# Patient Record
Sex: Male | Born: 1980 | Race: White | Hispanic: No | Marital: Single | Smoking: Current every day smoker
Health system: Southern US, Community
[De-identification: ages and names within clinical notes are randomized; demographics above are authoritative.]

---

## 2005-01-12 ENCOUNTER — Emergency Department: Payer: Self-pay | Admitting: Emergency Medicine

## 2006-10-04 ENCOUNTER — Emergency Department: Payer: Self-pay | Admitting: Unknown Physician Specialty

## 2006-10-14 ENCOUNTER — Emergency Department: Payer: Self-pay | Admitting: Unknown Physician Specialty

## 2007-06-01 ENCOUNTER — Emergency Department: Payer: Self-pay | Admitting: Unknown Physician Specialty

## 2009-07-31 ENCOUNTER — Emergency Department: Payer: Self-pay | Admitting: Emergency Medicine

## 2009-11-12 ENCOUNTER — Emergency Department: Payer: Self-pay | Admitting: Internal Medicine

## 2010-02-28 ENCOUNTER — Emergency Department: Payer: Self-pay | Admitting: Emergency Medicine

## 2011-05-15 ENCOUNTER — Emergency Department: Payer: Self-pay | Admitting: Internal Medicine

## 2011-07-14 ENCOUNTER — Emergency Department: Payer: Self-pay | Admitting: Emergency Medicine

## 2011-08-07 ENCOUNTER — Emergency Department: Payer: Self-pay | Admitting: Unknown Physician Specialty

## 2011-09-07 ENCOUNTER — Emergency Department: Payer: Self-pay | Admitting: Emergency Medicine

## 2012-01-30 ENCOUNTER — Emergency Department: Payer: Self-pay | Admitting: Emergency Medicine

## 2012-01-30 LAB — BASIC METABOLIC PANEL
BUN: 9 mg/dL (ref 7–18)
Chloride: 104 mmol/L (ref 98–107)
Creatinine: 0.87 mg/dL (ref 0.60–1.30)
EGFR (Non-African Amer.): >60
Glucose: 89 mg/dL (ref 65–99)
Potassium: 3.7 mmol/L (ref 3.5–5.1)
Sodium: 141 mmol/L (ref 136–145)

## 2012-01-30 LAB — CBC
MCH: 31.3 pg (ref 26.0–34.0)
MCHC: 34.4 g/dL (ref 32.0–36.0)
Platelet: 183 10*3/uL (ref 150–440)

## 2012-03-19 ENCOUNTER — Emergency Department: Payer: Self-pay | Admitting: Emergency Medicine

## 2012-03-22 ENCOUNTER — Emergency Department: Payer: Self-pay | Admitting: Emergency Medicine

## 2012-07-04 ENCOUNTER — Encounter (HOSPITAL_COMMUNITY): Payer: Self-pay | Admitting: Emergency Medicine

## 2012-07-04 ENCOUNTER — Emergency Department (HOSPITAL_COMMUNITY)
Admission: EM | Admit: 2012-07-04 | Discharge: 2012-07-04 | Disposition: A | Discharge status: Home / Self Care | Payer: Self-pay | Attending: Emergency Medicine | Admitting: Emergency Medicine

## 2012-07-04 DIAGNOSIS — K006 Disturbances in tooth eruption: Secondary | ICD-10-CM | POA: Insufficient documentation

## 2012-07-04 DIAGNOSIS — K0889 Other specified disorders of teeth and supporting structures: Secondary | ICD-10-CM

## 2012-07-04 DIAGNOSIS — F17200 Nicotine dependence, unspecified, uncomplicated: Secondary | ICD-10-CM | POA: Insufficient documentation

## 2012-07-04 MED ORDER — TRAMADOL HCL 50 MG PO TABS: 50.0000 mg | ORAL_TABLET | Freq: Four times a day (QID) | ORAL | Status: DC | PRN

## 2012-07-04 MED ORDER — PENICILLIN V POTASSIUM 250 MG PO TABS: 500.0000 mg | ORAL_TABLET | Freq: Four times a day (QID) | ORAL | Status: DC

## 2012-07-04 NOTE — ED Notes (Signed)
Pt c/o left upper tooth pain x 1 week

## 2012-07-04 NOTE — ED Provider Notes (Signed)
History    This chart was scribed for Nathaniel Baker, MD, MD by Smitty Pluck. The patient was seen in room [facility] and the patient's care was started at 5:43PM.   CSN: [id number]  Arrival date & time 07/04/12  1513   First MD Initiated Contact with Patient 07/04/12 1734      Chief Complaint  Patient presents with  . Dental Pain    (Consider location/radiation/quality/duration/timing/severity/associated sxs/prior treatment) Patient is a 31 y.o. male presenting with tooth pain. The history is provided by the patient.  Dental PainPrimary symptoms do not include fever or shortness of breath.   NOLYN SWAB is a 31 y.o. male who presents to the Emergency Department complaining of moderate left upper tooth pain onset 1 week. Pt report that he had bleeding today. Denies fever. Denies radiation. Denies any other pain. Pt took a dose of pcn pta  History reviewed. No pertinent past medical history.  History reviewed. No pertinent past surgical history.  History reviewed. No pertinent family history.  History  Substance Use Topics  . Smoking status: Current Everyday Smoker  . Smokeless tobacco: Not on file  . Alcohol Use: No      Review of Systems  Constitutional: Negative for fever and chills.  Respiratory: Negative for shortness of breath.   Gastrointestinal: Negative for nausea and vomiting.  Neurological: Negative for weakness.    Allergies  Vicodin  Home Medications   Current Outpatient Rx  Name Route Sig Dispense Refill  . CYCLOBENZAPRINE HCL 10 MG PO TABS Oral Take 10 mg by mouth 3 (three) times daily as needed. Muscle spasm.    Marland Kitchen PENICILLIN V POTASSIUM 250 MG PO TABS Oral Take 250 mg by mouth daily. Had some left over from another infection, has been taking those for the past couple days.    . TRAMADOL HCL 50 MG PO TABS Oral Take 50 mg by mouth every 6 (six) hours as needed. pain      BP 132/92  Pulse 87  Temp 98.6 F (37 C) (Oral)  Resp 16  SpO2  99%  Physical Exam  Nursing note and vitals reviewed. Constitutional: He is oriented to person, place, and time. He appears well-developed and well-nourished. No distress.  HENT:  Head: Normocephalic and atraumatic.       Impacted, fractured left upper 3rd molar with no abscess drainage.   Eyes: EOM are normal. Pupils are equal, round, and reactive to light.  Neck: Normal range of motion. Neck supple. No tracheal deviation present.  Cardiovascular: Normal rate.   Pulmonary/Chest: Effort normal. No respiratory distress.  Abdominal: Soft. He exhibits no distension.  Musculoskeletal: Normal range of motion.  Neurological: He is alert and oriented to person, place, and time.  Skin: Skin is warm and dry.  Psychiatric: He has a normal mood and affect. His behavior is normal.    ED Course  Procedures (including critical care time) DIAGNOSTIC STUDIES: Oxygen Saturation is 99% on room air, normal by my interpretation.    COORDINATION OF CARE:    Labs Reviewed - No data to display No results found.   No diagnosis found.    MDM  Pt tx for tooth abscess  I personally performed the services described in this documentation, which was scribed in my presence. The recorded information has been reviewed and considered.         Nathaniel Baker, MD 07/04/12 1754

## 2012-12-17 IMAGING — CR DG KNEE COMPLETE 4+V*R*
1 series · 4 of 4 positions shown · non-contrast
Comparison: none

REASON FOR EXAM: trauma
COMMENTS:

PROCEDURE:     DXR - DXR KNEE RT COMP WITH OBLIQUES  - March 19, 2012  [DATE]
RESULT:     Four views of the right knee are submitted. The bones are
adequately mineralized. I see no evidence of an acute fracture nor
dislocation. There is no tibial plateau abnormality. I see no joint effusion.

[Series 1: ap · 0.17mm/px · 4 of 4 slices shown]
[im 1/4]
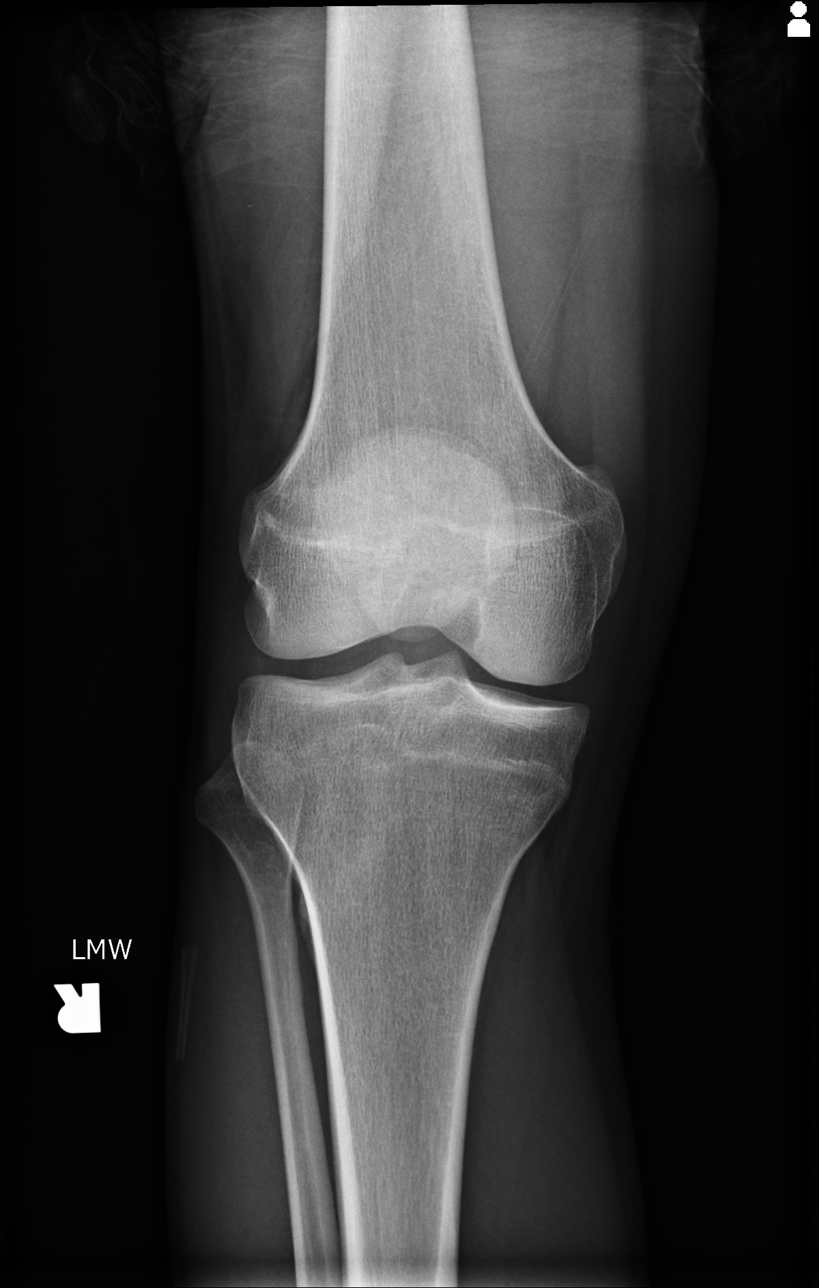
[im 2/4]
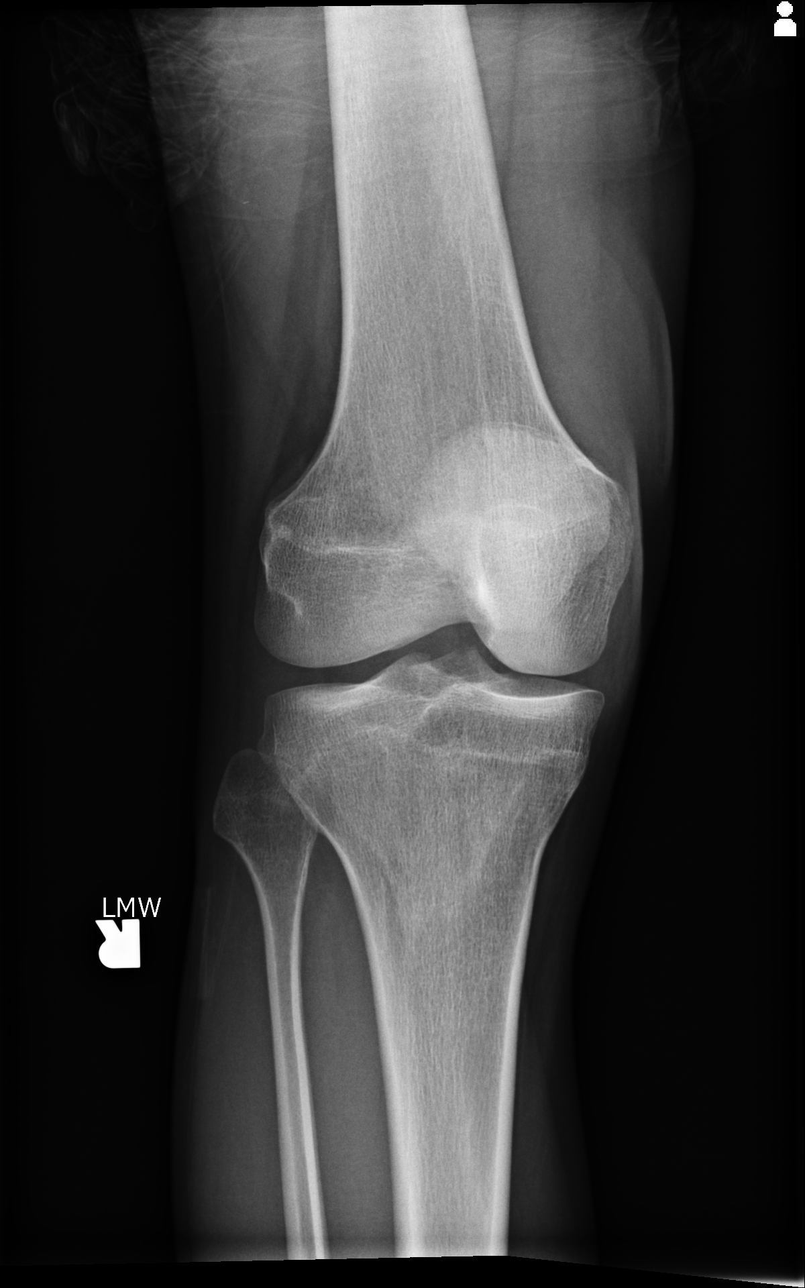
[im 3/4]
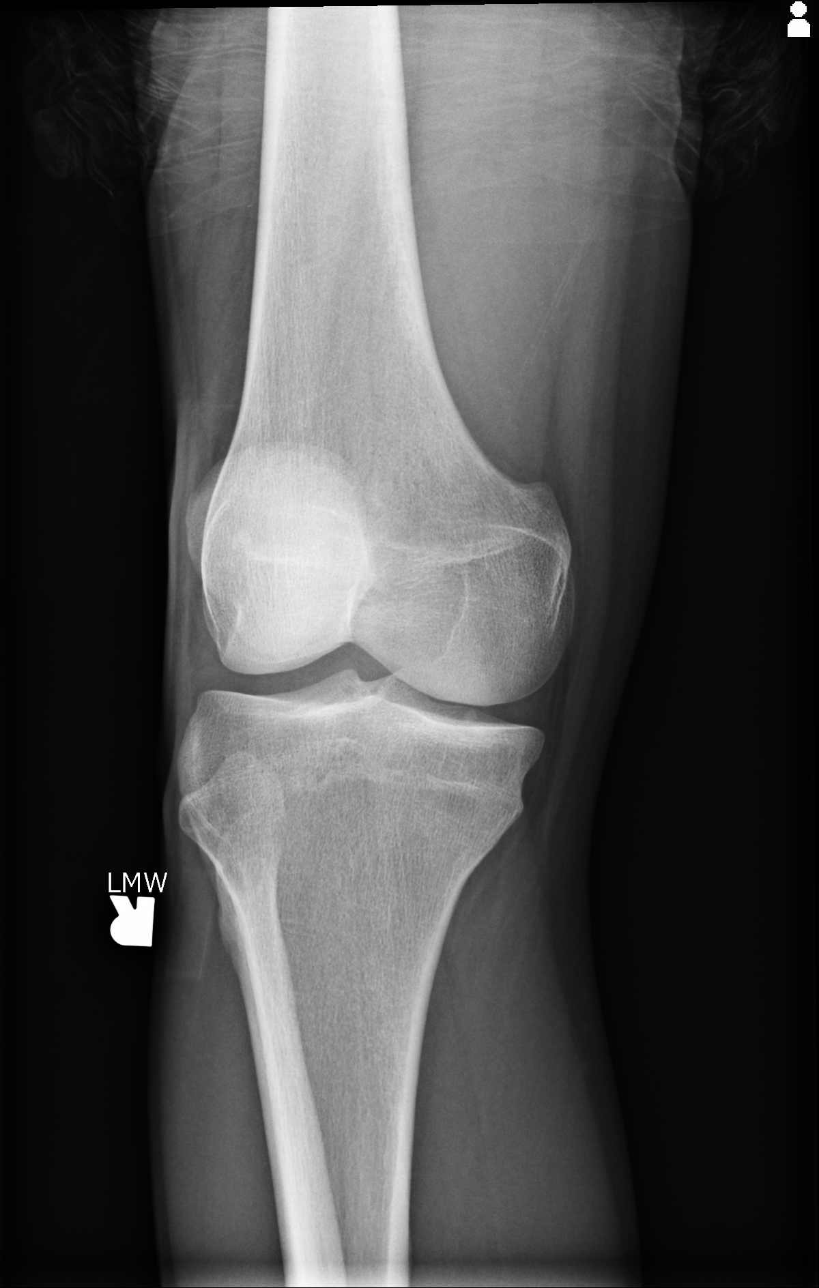
[im 4/4]
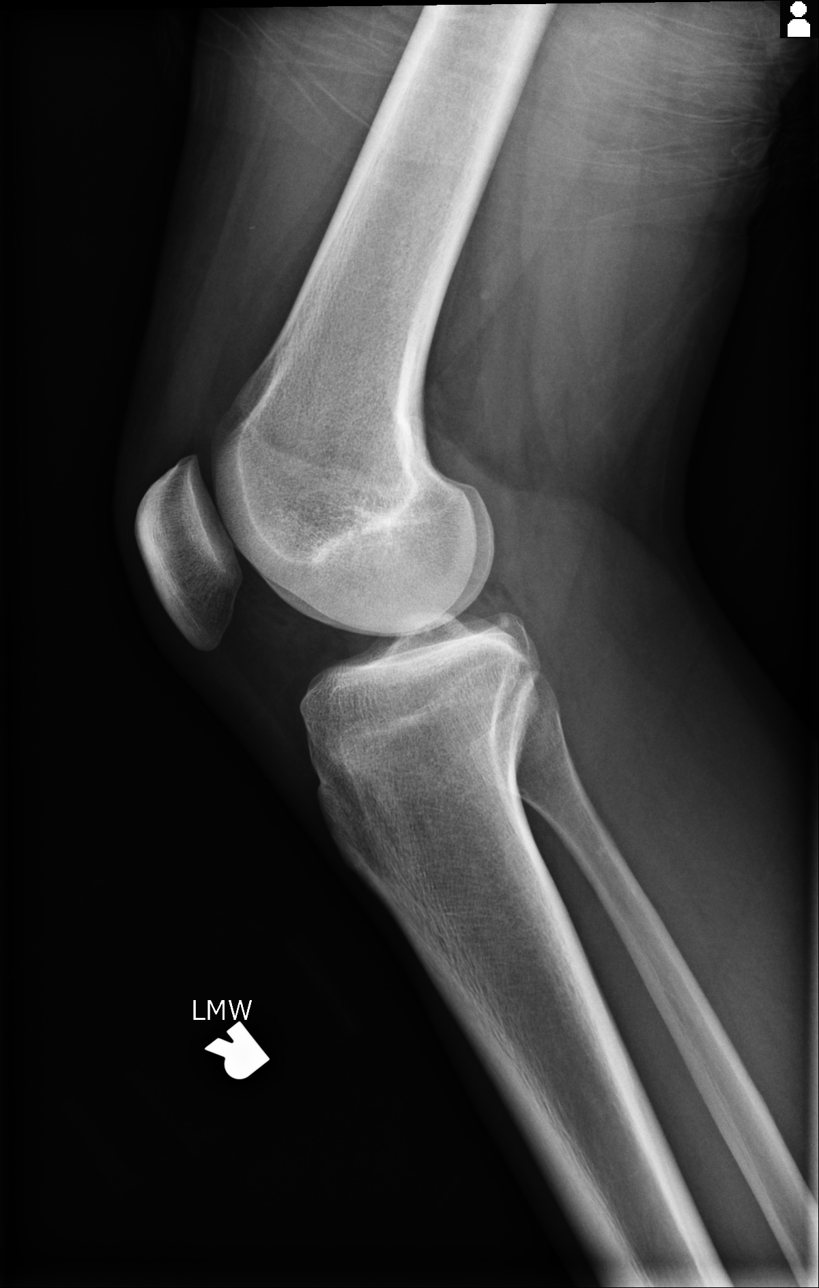

[4 of 4 positions shown; findings below may reference images not displayed]

IMPRESSION: I see no acute bony abnormality of the right knee.

## 2014-08-20 ENCOUNTER — Emergency Department: Payer: Self-pay | Admitting: Emergency Medicine

## 2014-10-03 ENCOUNTER — Emergency Department: Payer: Self-pay | Admitting: Emergency Medicine

## 2014-10-20 ENCOUNTER — Emergency Department: Payer: Self-pay | Admitting: Emergency Medicine

## 2014-11-14 ENCOUNTER — Emergency Department: Payer: Self-pay | Admitting: Emergency Medicine

## 2020-11-18 ENCOUNTER — Emergency Department
Admission: EM | Admit: 2020-11-18 | Discharge: 2020-11-19 | Disposition: A | Discharge status: Home / Self Care | Payer: Self-pay | Attending: Emergency Medicine | Admitting: Emergency Medicine

## 2020-11-18 ENCOUNTER — Other Ambulatory Visit: Payer: Self-pay

## 2020-11-18 ENCOUNTER — Emergency Department: Payer: Self-pay

## 2020-11-18 DIAGNOSIS — R4781 Slurred speech: Secondary | ICD-10-CM | POA: Insufficient documentation

## 2020-11-18 DIAGNOSIS — F17200 Nicotine dependence, unspecified, uncomplicated: Secondary | ICD-10-CM | POA: Insufficient documentation

## 2020-11-18 DIAGNOSIS — T50901A Poisoning by unspecified drugs, medicaments and biological substances, accidental (unintentional), initial encounter: Secondary | ICD-10-CM | POA: Insufficient documentation

## 2020-11-18 DIAGNOSIS — R000 Tachycardia, unspecified: Secondary | ICD-10-CM | POA: Insufficient documentation

## 2020-11-18 LAB — CBC WITH DIFFERENTIAL/PLATELET
Abs Immature Granulocytes: 0.21 10*3/uL — ABNORMAL HIGH (ref 0.00–0.07)
Basophils Absolute: 0.1 10*3/uL (ref 0.0–0.1)
Basophils Relative: 1 %
Eosinophils Absolute: 0 10*3/uL (ref 0.0–0.5)
Eosinophils Relative: 0 %
HCT: 44.7 % (ref 39.0–52.0)
Hemoglobin: 15.5 g/dL (ref 13.0–17.0)
Immature Granulocytes: 2 %
Lymphocytes Relative: 20 %
Lymphs Abs: 2.1 10*3/uL (ref 0.7–4.0)
MCH: 30.3 pg (ref 26.0–34.0)
MCHC: 34.7 g/dL (ref 30.0–36.0)
MCV: 87.5 fL (ref 80.0–100.0)
Monocytes Absolute: 0.7 10*3/uL (ref 0.1–1.0)
Monocytes Relative: 7 %
Neutro Abs: 7.2 10*3/uL (ref 1.7–7.7)
Neutrophils Relative %: 70 %
Platelets: 236 10*3/uL (ref 150–400)
RBC: 5.11 MIL/uL (ref 4.22–5.81)
RDW: 12.4 % (ref 11.5–15.5)
WBC: 10.3 10*3/uL (ref 4.0–10.5)
nRBC: 0 % (ref 0.0–0.2)

## 2020-11-18 LAB — BASIC METABOLIC PANEL
Anion gap: 20 — ABNORMAL HIGH (ref 5–15)
Anion gap: 15 (ref 5–15)
BUN: 25 mg/dL — ABNORMAL HIGH (ref 6–20)
BUN: 21 mg/dL — ABNORMAL HIGH (ref 6–20)
CO2: 17 mmol/L — ABNORMAL LOW (ref 22–32)
CO2: 18 mmol/L — ABNORMAL LOW (ref 22–32)
Calcium: 9.6 mg/dL (ref 8.9–10.3)
Calcium: 8.6 mg/dL — ABNORMAL LOW (ref 8.9–10.3)
Chloride: 97 mmol/L — ABNORMAL LOW (ref 98–111)
Chloride: 101 mmol/L (ref 98–111)
Creatinine, Ser: 1.38 mg/dL — ABNORMAL HIGH (ref 0.61–1.24)
Creatinine, Ser: 1.11 mg/dL (ref 0.61–1.24)
GFR, Estimated: >60 mL/min (ref >60)
GFR, Estimated: >60 mL/min (ref >60)
Glucose, Bld: 139 mg/dL — ABNORMAL HIGH (ref 70–99)
Glucose, Bld: 116 mg/dL — ABNORMAL HIGH (ref 70–99)
Potassium: 3.5 mmol/L (ref 3.5–5.1)
Potassium: 4.3 mmol/L (ref 3.5–5.1)
Sodium: 134 mmol/L — ABNORMAL LOW (ref 135–145)
Sodium: 134 mmol/L — ABNORMAL LOW (ref 135–145)

## 2020-11-18 MED ORDER — LACTATED RINGERS IV BOLUS
1000.0000 mL | Freq: Once | INTRAVENOUS | Status: AC
Start: 2020-11-18 — End: 2020-11-19
  Administered 2020-11-18: 1000 mL via INTRAVENOUS

## 2020-11-18 MED ORDER — SODIUM CHLORIDE 0.9 % IV BOLUS
1000.0000 mL | Freq: Once | INTRAVENOUS | Status: AC
  Administered 2020-11-18: 1000 mL via INTRAVENOUS

## 2020-11-18 MED ORDER — ONDANSETRON HCL 4 MG/2ML IJ SOLN
4.0000 mg | Freq: Once | INTRAMUSCULAR | Status: AC
  Administered 2020-11-18: 4 mg via INTRAVENOUS
  Filled 2020-11-18: qty 2

## 2020-11-18 MED ORDER — KETOROLAC TROMETHAMINE 30 MG/ML IJ SOLN
30.0000 mg | Freq: Once | INTRAMUSCULAR | Status: AC
  Administered 2020-11-18: 30 mg via INTRAVENOUS
  Filled 2020-11-18: qty 1

## 2020-11-18 NOTE — ED Notes (Signed)
Officers at bedside.  

## 2020-11-18 NOTE — ED Provider Notes (Addendum)
[facility] Emergency Department Provider Note ____________________________________________   First MD Initiated Contact with Patient 11/18/20 2046     (approximate)  I have reviewed the triage vital signs and the nursing notes.   HISTORY  Chief Complaint Drug Overdose  Level 5 caveat: History present illness limited due to intoxication/altered mental status  HPI Nathaniel Coffey is a 39 y.o. male with no active medical problems who presents after unintentional overdose.  [facility] and states that he snorted what he believed was fentanyl.  When EMS arrived, CPR was in progress.  Jail staff had given IM Narcan.  IV Narcan was given, and the patient began to breathe spontaneously and awoke.  He continues to be somewhat intoxicated appearing but is able to answer questions.  He reports pain to the chest where he had CPR performed.  He states he feels like he got hit by a truck.  He denies other focal complaints.  History reviewed. No pertinent past medical history.  There are no problems to display for this patient.   History reviewed. No pertinent surgical history.  Prior to Admission medications   Not on File    Allergies Vicodin [hydrocodone-acetaminophen]  No family history on file.  Social History Social History   Tobacco Use  . Smoking status: Current Every Day Smoker  . Smokeless tobacco: Never Used  Substance Use Topics  . Alcohol use: No  . Drug use: Yes    Review of Systems Level 5 caveat: Review of systems limited due to drug intoxication/altered mental status  Cardiovascular: Positive for chest pain. Respiratory: Denies shortness of breath. Gastrointestinal: Positive for nausea. Skin: Negative for rash. Neurological: Negative for headache.   ____________________________________________   PHYSICAL EXAM:  VITAL SIGNS: ED Triage Vitals  Enc Vitals Group     BP 11/18/20 2052 111/81      Pulse Rate 11/18/20 2052 (!) 105     Resp 11/18/20 2052 20     Temp 11/18/20 2052 98.4 F (36.9 C)     Temp Source 11/18/20 2052 Oral     SpO2 11/18/20 2052 94 %     Weight 11/18/20 2046 158 lb (71.7 kg)     Height 11/18/20 2046 6' (1.829 m)     Head Circumference --      Peak Flow --      Pain Score 11/18/20 2046 6     Pain Loc --      Pain Edu? --      Excl. in GC? --     Constitutional: Tired appearing but awake.  Oriented x4. Eyes: Conjunctivae are normal.  EOMI.  PERRL. Head: Atraumatic. Nose: No congestion/rhinnorhea. Mouth/Throat: Mucous membranes are dry. Neck: Normal range of motion.  Cardiovascular: Tachycardic, regular rhythm. Grossly normal heart sounds.  Good peripheral circulation. Respiratory: Normal respiratory effort.  No retractions. Lungs CTAB. Gastrointestinal: Soft and nontender. No distention.  Genitourinary: No flank tenderness. Musculoskeletal: No lower extremity edema.  Extremities warm and well perfused.  Neurologic: Slurred speech.  Motor intact in all extremities. Skin:  Skin is warm and dry. No rash noted. Psychiatric: Calm and cooperative.  ____________________________________________   LABS (all labs ordered are listed, but only abnormal results are displayed)  Labs Reviewed  CBC WITH DIFFERENTIAL/PLATELET - Abnormal; Notable for the following components:      Result Value   Abs Immature Granulocytes 0.21 (*)    All other components within normal limits  BASIC METABOLIC  PANEL - Abnormal; Notable for the following components:   Sodium 134 (*)    Chloride 97 (*)    CO2 17 (*)    Glucose, Bld 139 (*)    BUN 25 (*)    Creatinine, Ser 1.38 (*)    Anion gap 20 (*)    All other components within normal limits  BASIC METABOLIC PANEL - Abnormal; Notable for the following components:   Sodium 134 (*)    CO2 18 (*)    Glucose, Bld 116 (*)    BUN 21 (*)    Calcium 8.6 (*)    All other components within normal limits  URINE DRUG SCREEN,  QUALITATIVE ([facility] ONLY)   ____________________________________________  EKG  ED ECG REPORT I, Dionne Bucy, the attending physician, personally viewed and interpreted this ECG.  Date: 11/18/2020 EKG Time: 2053 Rate: 126 Rhythm: Sinus tachycardia QRS Axis: Right axis Intervals: normal ST/T Wave abnormalities: normal Narrative Interpretation: no evidence of acute ischemia  ____________________________________________  RADIOLOGY  CXR interpreted by me shows no focal infiltrate or edema  ____________________________________________   PROCEDURES  Procedure(s) performed: No  Procedures  Critical Care performed: Yes  CRITICAL CARE Performed by: Dionne Bucy   Total critical care time: 30 minutes  Critical care time was exclusive of separately billable procedures and treating other patients.  Critical care was necessary to treat or prevent imminent or life-threatening deterioration.  Critical care was time spent personally by me on the following activities: development of treatment plan with patient and/or surrogate as well as nursing, discussions with consultants, evaluation of patient's response to treatment, examination of patient, obtaining history from patient or surrogate, ordering and performing treatments and interventions, ordering and review of laboratory studies, ordering and review of radiographic studies, pulse oximetry and re-evaluation of patient's condition. ____________________________________________   INITIAL IMPRESSION / ASSESSMENT AND PLAN / ED COURSE  Pertinent labs & imaging results that were available during my care of the patient were reviewed by me and considered in my medical decision making (see chart for details).  39 year old male with no active medical problems presents after an apparent unintentional drug overdose.  The patient states he believes he snorted fentanyl while in custody at the jail.  When EMS arrived, CPR was in  progress.  He received 2 doses of Narcan, and EMS states that by the time they actually assessed the patient he had a pulse and was breathing spontaneously.  He now is more awake and is able answer questions.  On exam, the vital signs are normal except for tachycardia.  He is maintaining his own airway and has normal respirations.  There is an ecchymosis to the anterior chest wall from CPR but no other trauma.  Neurologic exam is nonfocal.  Presentation is consistent with likely opiate overdose.  We will obtain basic labs, UDS, chest x-ray, and give fluids.  It is unclear whether CPR was indicated given he had a pulse on EMS arrival; I doubt he had cardiac arrest, however he likely did have respiratory failure.  I will plan to observe the patient really several hours in the ED.  If his work-up is reassuring and he is fully sober I anticipate he will be appropriate for discharge.  ----------------------------------------- 11:21 PM on 11/18/2020 -----------------------------------------  Lab work-up is unremarkable except for slightly elevated creatinine and anion gap, likely due to dehydration and decreased respirations.  The patient's vital signs are stable.  He continues to complain of some chest wall pain so we will  give Toradol.  Plan will be to observe for another few hours.  If he returns to baseline, he may be discharged back to jail.  Otherwise, he may need admission.  I signed him out to the oncoming physician Dr. Katrinka Blazing.  ____________________________________________   FINAL CLINICAL IMPRESSION(S) / ED DIAGNOSES  Final diagnoses:  Accidental drug overdose, initial encounter      NEW MEDICATIONS STARTED DURING THIS VISIT:  New Prescriptions   No medications on file     Note:  This document was prepared using Dragon voice recognition software and may include unintentional dictation errors.    Dionne Bucy, MD 11/18/20 2322    Dionne Bucy, MD 11/18/20 [phone]

## 2020-11-18 NOTE — ED Provider Notes (Signed)
I assumed care of this patient from outgoing provider present 2200.  In brief patient [facility] EMS from prison with concerns for overdose accidentally from snorting fentanyl.  Prison staff provided CPR but per EMS patient had a pulse on arrival and his mental status improved after Narcan was administered.  Patient was complaining of some chest pain after he received compressions chest x-ray shows no evidence of fracture.  Labs obtained show evidence of some dehydration which showed improvement on repeat BMP after IV fluids.  EKG is reassuring for low suspicion for ACS at this time.  Patient was observed for approximately 4 hours emergency room without requiring any additional Narcan.  Given improvement in anion gap as well as patient not requiring any additional reversal I believe he is safe for discharge back to prison at this time.  Discharged stable condition.   Gilles Chiquito, MD 11/19/20 [phone]

## 2020-11-18 NOTE — ED Triage Notes (Signed)
Pt to ED from [facility] jail, pt a&o x4, pt OD on fentanyl that was given to him, pt states he snorted the fentanyl. Per ems cpr was administered by officer in jail, on ems arrival pt agonal breathing pt was bagged and tachy. Pt was given 2mg  narcan IM and 1mg  IV

## 2021-08-18 IMAGING — DX DG CHEST 1V PORT
2 series · 2 of 2 positions shown · non-contrast
Comparison: Chest radiograph dated 03/19/2012

CLINICAL DATA: 39-year-old male with chest pain.  Status post CPR.

EXAM:
PORTABLE CHEST 1 VIEW

[chest ap (1 of 2)]
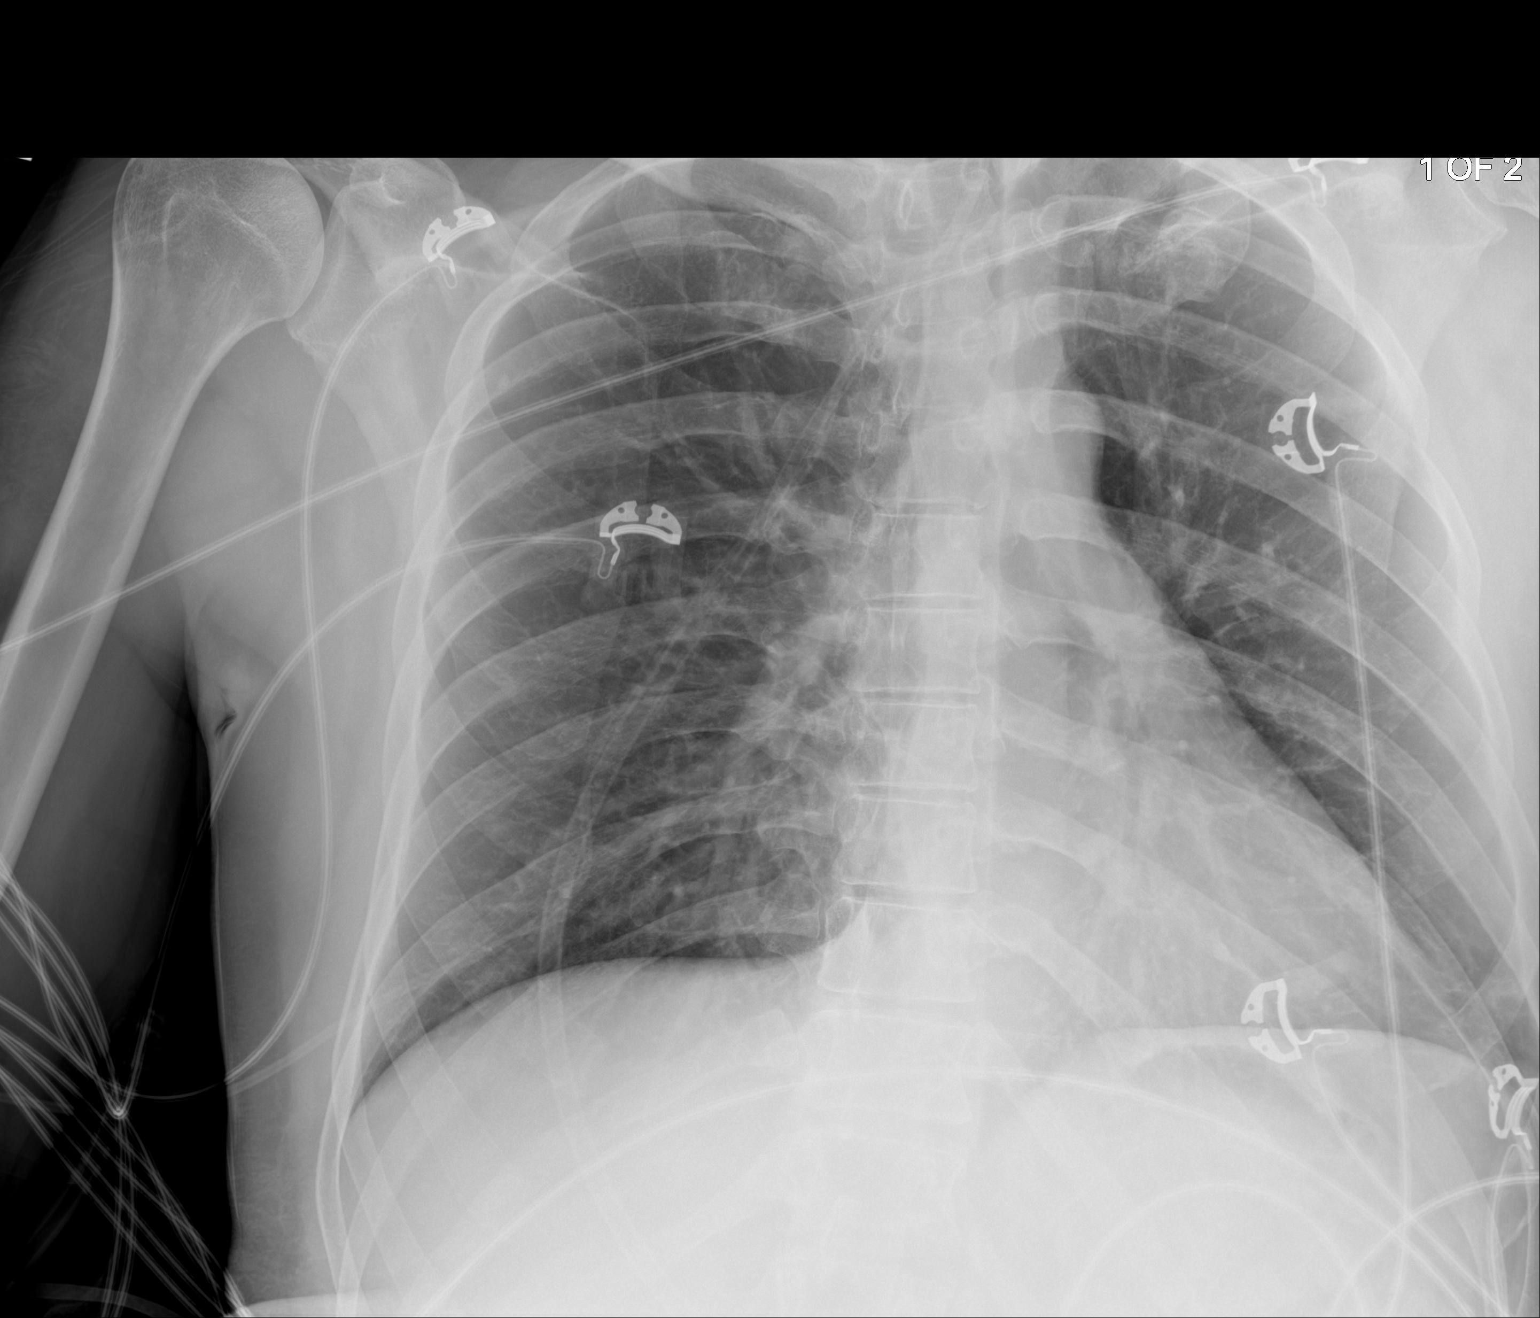

[chest ap (2 of 2)]
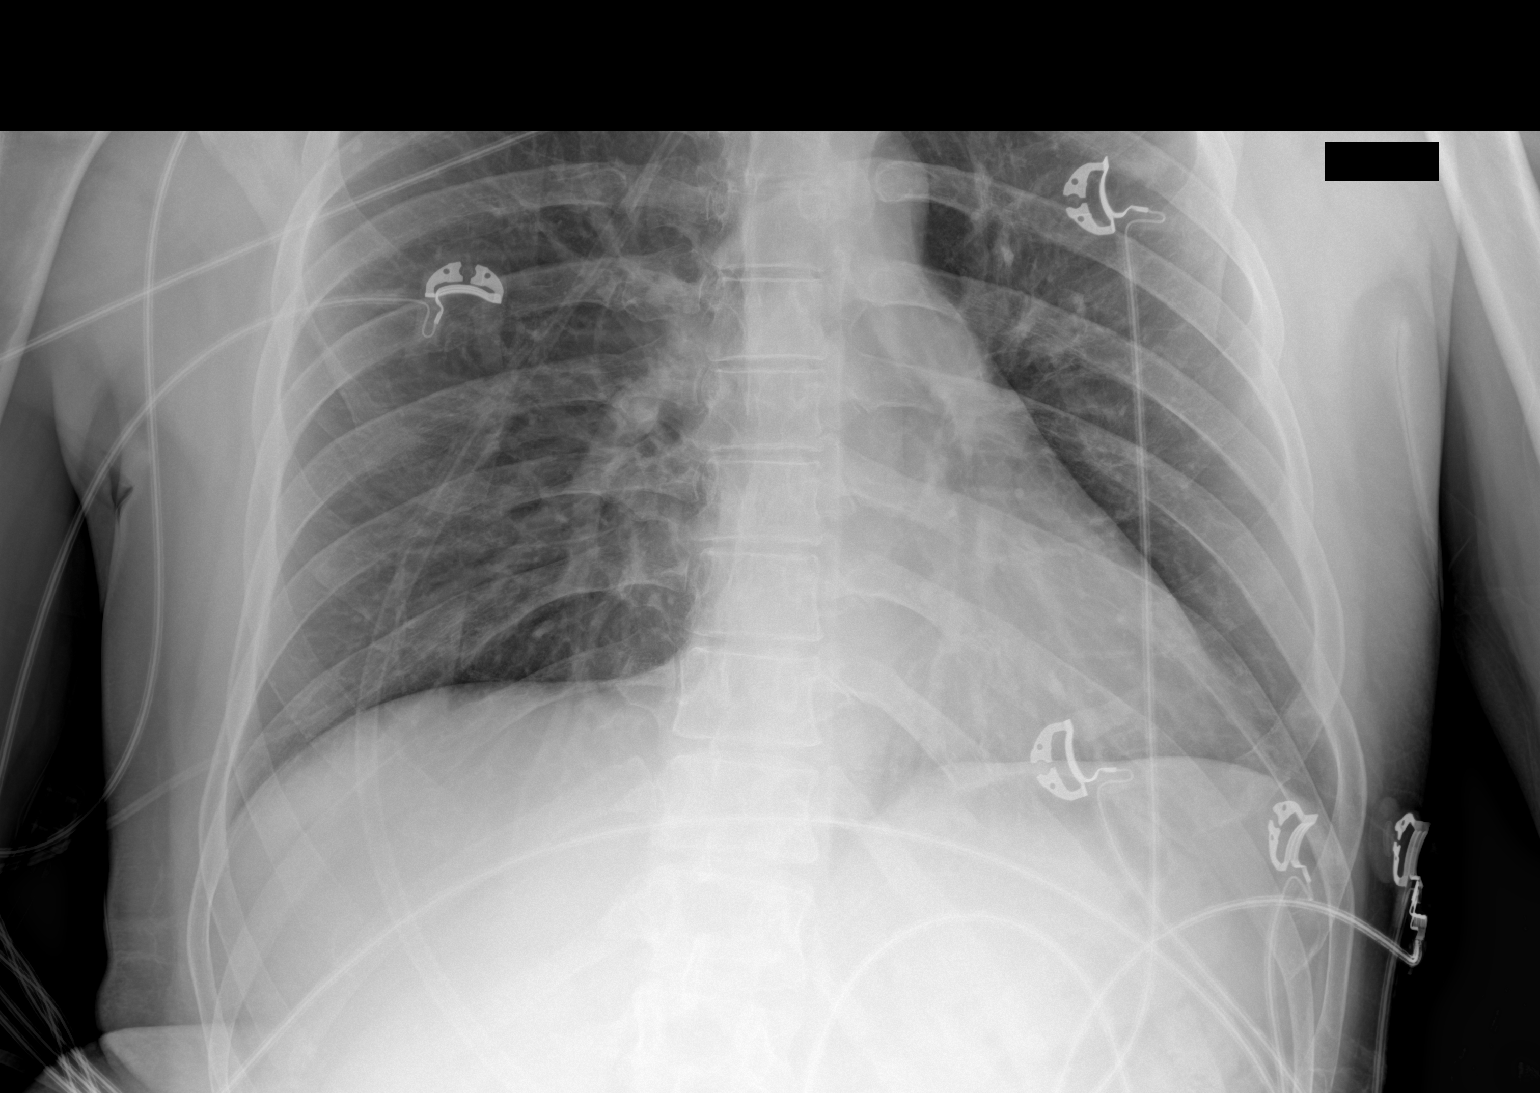

[2 of 2 positions shown; findings below may reference images not displayed]

FINDINGS: No focal consolidation, pleural effusion, pneumothorax. The cardiac
silhouette is within limits. No acute osseous pathology.
IMPRESSION: No active disease.

## 2022-09-06 ENCOUNTER — Emergency Department
Admission: EM | Admit: 2022-09-06 | Discharge: 2022-09-06 | Disposition: A | Discharge status: Home / Self Care | Payer: Self-pay | Attending: Emergency Medicine | Admitting: Emergency Medicine

## 2022-09-06 ENCOUNTER — Emergency Department: Payer: Self-pay

## 2022-09-06 ENCOUNTER — Other Ambulatory Visit: Payer: Self-pay

## 2022-09-06 ENCOUNTER — Encounter: Payer: Self-pay | Admitting: Emergency Medicine

## 2022-09-06 DIAGNOSIS — L03032 Cellulitis of left toe: Secondary | ICD-10-CM | POA: Insufficient documentation

## 2022-09-06 MED ORDER — LIDOCAINE HCL (PF) 1 % IJ SOLN
INTRAMUSCULAR | Status: AC
  Administered 2022-09-06: 2 mL
  Filled 2022-09-06: qty 5

## 2022-09-06 MED ORDER — DOXYCYCLINE MONOHYDRATE 100 MG PO TABS: 100.0000 mg | ORAL_TABLET | Freq: Two times a day (BID) | ORAL | 0 refills | Status: AC

## 2022-09-06 MED ORDER — CEFTRIAXONE SODIUM 1 G IJ SOLR
1.0000 g | Freq: Once | INTRAMUSCULAR | Status: AC
  Administered 2022-09-06: 1 g via INTRAMUSCULAR
  Filled 2022-09-06: qty 10

## 2022-09-06 MED ORDER — CEFTRIAXONE SODIUM 1 G IJ SOLR: 1.0000 g | Freq: Once | INTRAMUSCULAR | 0 refills | Status: DC

## 2022-09-06 MED ORDER — CEPHALEXIN 500 MG PO CAPS: 500.0000 mg | ORAL_CAPSULE | Freq: Four times a day (QID) | ORAL | 0 refills | Status: AC

## 2022-09-06 MED ORDER — LIDOCAINE HCL (PF) 1 % IJ SOLN: 2.0000 mL | Freq: Once | INTRAMUSCULAR | Status: AC

## 2022-09-06 NOTE — ED Triage Notes (Signed)
Pt to ED via POV, pt states that he thinks that his foot is infected. Pt states that he has an open wound on his left foot between his toes. Foot is swollen and red.

## 2022-09-06 NOTE — Discharge Instructions (Signed)
Take 2 tablets of Keflex in the morning and 2 tablets at night. Take 1 tablet of doxycycline in the morning and 1 tablet at night. Please make an appointment with wound care.

## 2022-09-06 NOTE — ED Provider Notes (Signed)
[facility] Provider Note  Patient Contact: 6:09 PM (approximate)   History   Foot Injury   HPI  Nathaniel Coffey is a 41 y.o. male presents to the emergency department with erythema along the dorsal aspect of the left foot and swelling of the left fourth toe with lateral ulceration of the digit.  Patient states that swelling and erythema has been more noticeable over the past 2 to 3 days but swelling is worse today.  He states that he has been afebrile to his knowledge.  Denies similar injuries/foot cellulitis in the past.  Denies injecting in the affected area.      Physical Exam   Triage Vital Signs: ED Triage Vitals [09/06/22 1749]  Enc Vitals Group     BP 123/80     Pulse Rate 73     Resp 16     Temp 98.6 F (37 C)     Temp Source Oral     SpO2 100 %     Weight      Height      Head Circumference      Peak Flow      Pain Score 7     Pain Loc      Pain Edu?      Excl. in GC?     Most recent vital signs: Vitals:   09/06/22 1749  BP: 123/80  Pulse: 73  Resp: 16  Temp: 98.6 F (37 C)  SpO2: 100%     General: Alert and in no acute distress. Eyes:  PERRL. EOMI. Head: No acute traumatic findings ENT:      Nose: No congestion/rhinnorhea.      Mouth/Throat: Mucous membranes are moist. Neck: No stridor. No cervical spine tenderness to palpation. Cardiovascular:  Good peripheral perfusion Respiratory: Normal respiratory effort without tachypnea or retractions. Lungs CTAB. Good air entry to the bases with no decreased or absent breath sounds. Gastrointestinal: Bowel sounds 4 quadrants. Soft and nontender to palpation. No guarding or rigidity. No palpable masses. No distention. No CVA tenderness. Musculoskeletal: Full range of motion to all extremities.  Neurologic:  No gross focal neurologic deficits are appreciated.  Skin: Patient has erythema of the left fourth toe with associated ulceration.    ED Results / Procedures / Treatments    Labs (all labs ordered are listed, but only abnormal results are displayed) Labs Reviewed - No data to display      RADIOLOGY  I personally viewed and evaluated these images as part of my medical decision making, as well as reviewing the written report by the radiologist.  ED Provider Interpretation: No foreign body, acute fracture or osteomyelitis identified on x-ray of the left foot.   PROCEDURES:  Critical Care performed: No  Procedures   MEDICATIONS ORDERED IN ED: Medications  cefTRIAXone (ROCEPHIN) injection 1 g (1 g Intramuscular Given 09/06/22 1928)  lidocaine (PF) (XYLOCAINE) 1 % injection 2 mL (2 mLs Other Given 09/06/22 1929)     IMPRESSION / MDM / ASSESSMENT AND PLAN / ED COURSE  I reviewed the triage vital signs and the nursing notes.                              Assessment and plan Foot cellulitis 41 year old male presents to the emergency department with erythema and edema along the dorsal aspect of the left foot with edema of the left fourth digit with lateral ulceration.  Vital  signs are reassuring at triage.  On exam, patient was alert and nontoxic-appearing.  He had no palpable induration or fluctuance to suggest abscess.  Patient had a palpable dorsalis pedis pulse and capillary refill was less than 2 seconds on the left.  Will obtain x-ray of the left foot to rule out foreign body/osteomyelitis with plan to start patient on doxycycline and Keflex initially.  Patient was given a wound care referral and was given an injection of of Rocephin prior to discharge.   X-ray of the left foot unremarkable.  Patient was given instructions on making an appointment with wound care.  Return precautions were given to return with new or worsening symptoms.   FINAL CLINICAL IMPRESSION(S) / ED DIAGNOSES   Final diagnoses:  Cellulitis of toe of left foot     Rx / DC Orders   ED Discharge Orders          Ordered    cefTRIAXone (ROCEPHIN) 1 g injection    Once,   Status:  Discontinued        09/06/22 1901    cephALEXin (KEFLEX) 500 MG capsule  4 times daily        09/06/22 1914    doxycycline (ADOXA) 100 MG tablet  2 times daily        09/06/22 1914             Note:  This document was prepared using Dragon voice recognition software and may include unintentional dictation errors.   Pia Mau [place], PA-C 09/06/22 1932    Georga Hacking, MD 09/08/22 [phone]
